# Patient Record
Sex: Female | Born: 2000 | Race: White | Hispanic: No | Marital: Single | Smoking: Never smoker
Health system: Southern US, Community
[De-identification: ages and names within clinical notes are randomized; demographics above are authoritative.]

---

## 2020-09-21 ENCOUNTER — Encounter (HOSPITAL_COMMUNITY): Payer: Self-pay | Admitting: Emergency Medicine

## 2020-09-21 ENCOUNTER — Emergency Department (HOSPITAL_COMMUNITY): Payer: Commercial Managed Care - PPO

## 2020-09-21 ENCOUNTER — Other Ambulatory Visit: Payer: Self-pay

## 2020-09-21 ENCOUNTER — Emergency Department (HOSPITAL_COMMUNITY)
Admission: EM | Admit: 2020-09-21 | Discharge: 2020-09-21 | Disposition: A | Discharge status: Home / Self Care | Payer: Commercial Managed Care - PPO | Attending: Emergency Medicine | Admitting: Emergency Medicine

## 2020-09-21 DIAGNOSIS — S161XXA Strain of muscle, fascia and tendon at neck level, initial encounter: Secondary | ICD-10-CM

## 2020-09-21 DIAGNOSIS — M546 Pain in thoracic spine: Secondary | ICD-10-CM | POA: Insufficient documentation

## 2020-09-21 DIAGNOSIS — S01551A Open bite of lip, initial encounter: Secondary | ICD-10-CM | POA: Insufficient documentation

## 2020-09-21 DIAGNOSIS — Y92410 Unspecified street and highway as the place of occurrence of the external cause: Secondary | ICD-10-CM | POA: Diagnosis not present

## 2020-09-21 DIAGNOSIS — S0990XA Unspecified injury of head, initial encounter: Secondary | ICD-10-CM | POA: Insufficient documentation

## 2020-09-21 DIAGNOSIS — R0789 Other chest pain: Secondary | ICD-10-CM

## 2020-09-21 LAB — I-STAT BETA HCG BLOOD, ED (MC, WL, AP ONLY): I-stat hCG, quantitative: <5 m[IU]/mL (ref <5)

## 2020-09-21 MED ORDER — SODIUM CHLORIDE 0.9 % IV BOLUS
1000.0000 mL | Freq: Once | INTRAVENOUS | Status: AC
  Administered 2020-09-21: 1000 mL via INTRAVENOUS

## 2020-09-21 MED ORDER — KETOROLAC TROMETHAMINE 30 MG/ML IJ SOLN
30.0000 mg | Freq: Once | INTRAMUSCULAR | Status: AC
  Administered 2020-09-21: 30 mg via INTRAVENOUS
  Filled 2020-09-21: qty 1
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
## 2020-09-21 NOTE — ED Notes (Signed)
Patient returned from ct

## 2020-09-21 NOTE — ED Notes (Signed)
Patient returned from xray.
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
## 2020-09-21 NOTE — ED Provider Notes (Signed)
Patient CARE signed out to follow-up CT and x-ray results for final disposition.  CT and x-ray results reviewed no acute abnormalities.  No fractures, no bleeding.  Patient stable for outpatient follow-up.  Kenton Kingfisher, MD 09/21/20 [phone]

## 2020-09-21 NOTE — ED Notes (Signed)
Discharge instructions reviewed. Confirmed understanding and pain management.
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
## 2020-09-21 NOTE — ED Notes (Signed)
Patient taken to ct

## 2020-09-21 NOTE — Discharge Instructions (Addendum)
Use Tylenol and ibuprofen every 6 hours as needed for pain.  Use ice regularly. Return for new or worsening concerns.

## 2020-09-21 NOTE — ED Notes (Signed)
Patient taken to xray by transport  
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
